# Patient Record
Sex: Female | Born: 1995 | Race: White | Hispanic: No | Marital: Single | Smoking: Former smoker
Health system: Southern US, Community
[De-identification: ages and names within clinical notes are randomized; demographics above are authoritative.]

## PROBLEM LIST (undated history)

## (undated) DIAGNOSIS — J45909 Unspecified asthma, uncomplicated: Secondary | ICD-10-CM

---
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
## 2017-03-12 IMAGING — MR MR HEAD W/O CM
4 of 8 series · 23 of 48 positions shown · non-contrast
Comparison: None.

CLINICAL DATA: Initial e frequent dizzy spells with headaches and
shakiness. History of asthma. Valuation for

EXAM:
MRI HEAD WITHOUT CONTRAST
TECHNIQUE: Multiplanar, multiecho pulse sequences of the brain and surrounding
structures were obtained without intravenous contrast.

[Series 6: T2 · axial · 5.0mm · 0.39mm/px · z∈[-42,+101]mm · 4 of 23 slices shown (1 of 2)]
[im 1/23]
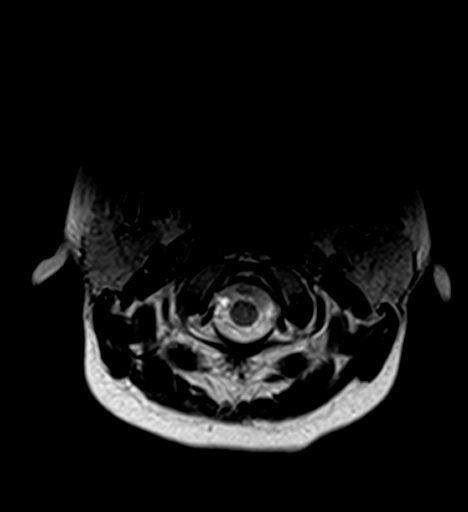
[im 8/23]
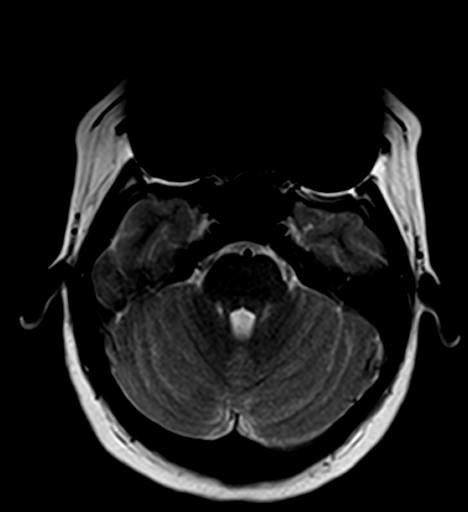
[im 15/23]
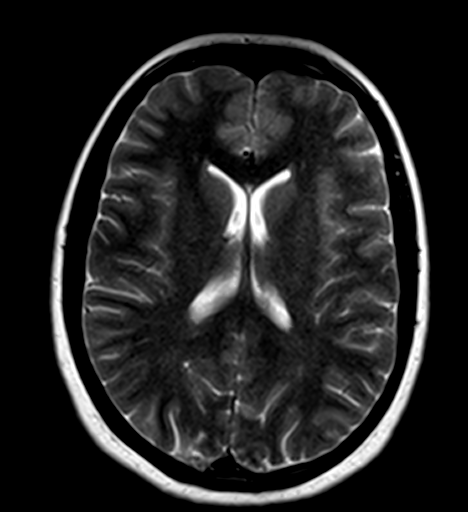
[im 23/23]
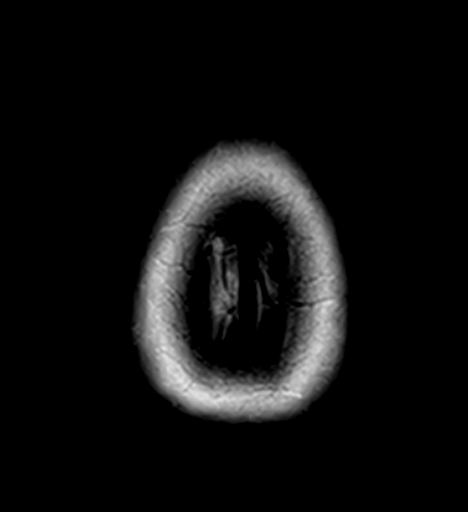

[Series 8: FLAIR · axial · 5.0mm · 0.42mm/px · z∈[-41,+101]mm · 5 of 23 slices shown]
[im 1/23]
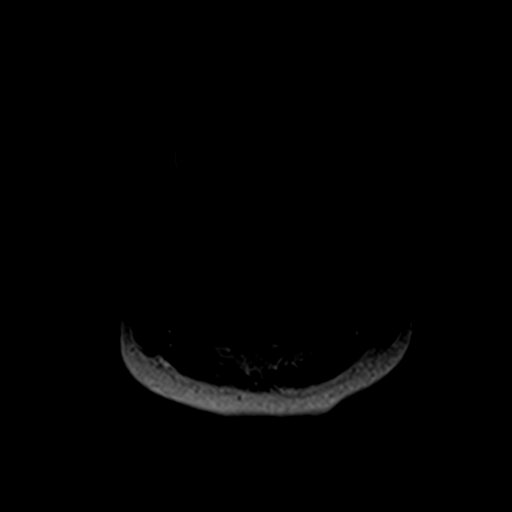
[im 6/23]
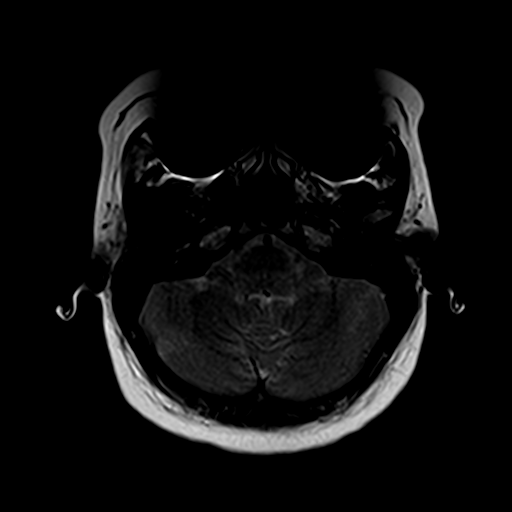
[im 12/23]
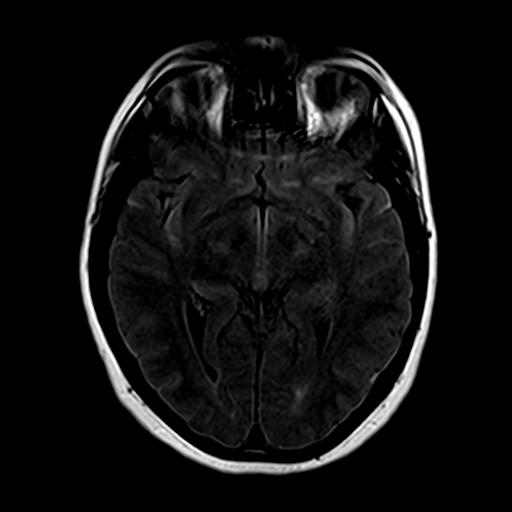
[im 17/23]
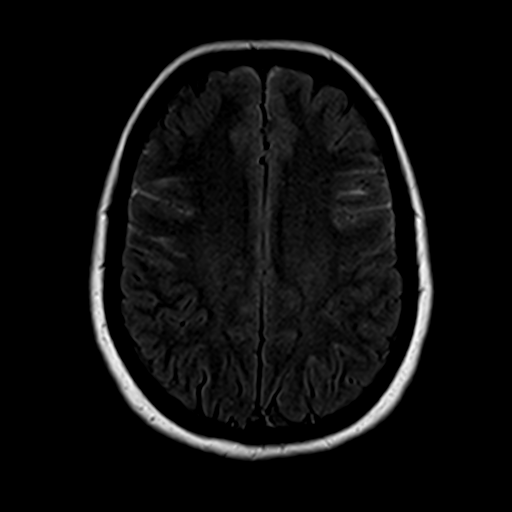
[im 23/23]
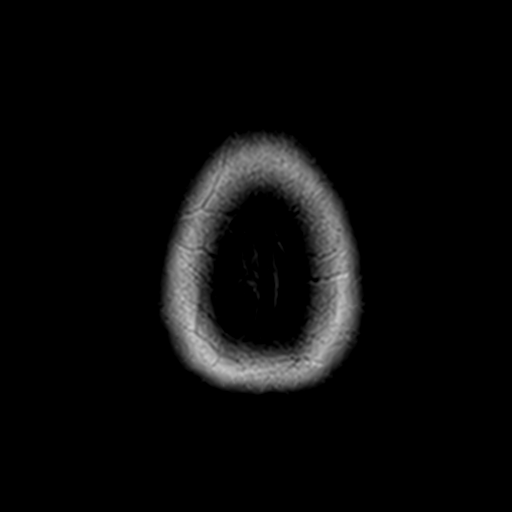

[Series 9: T1 · axial · 2.0mm · 0.42mm/px · z∈[-37,+85]mm · 8 of 73 slices shown]
[im 1/73]
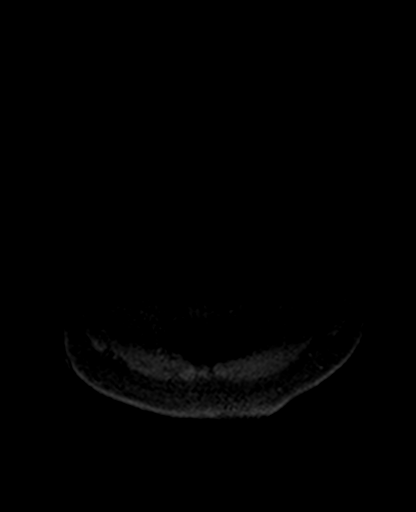
[im 11/73]
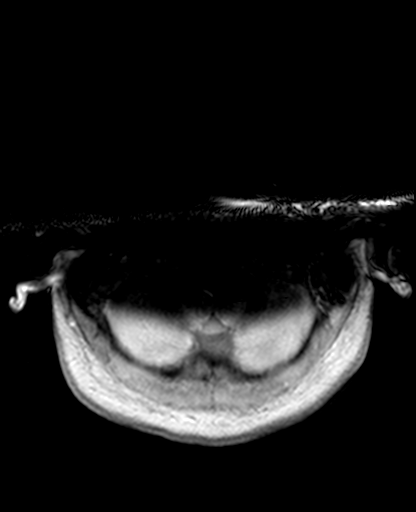
[im 21/73]
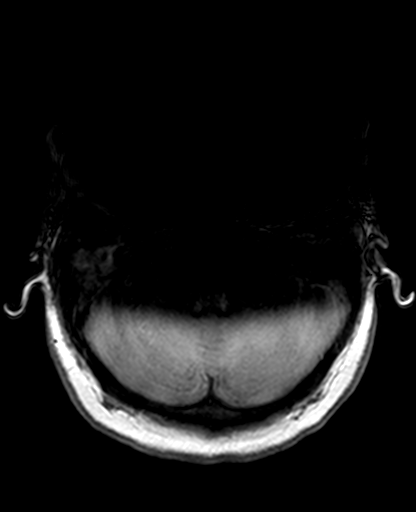
[im 31/73]
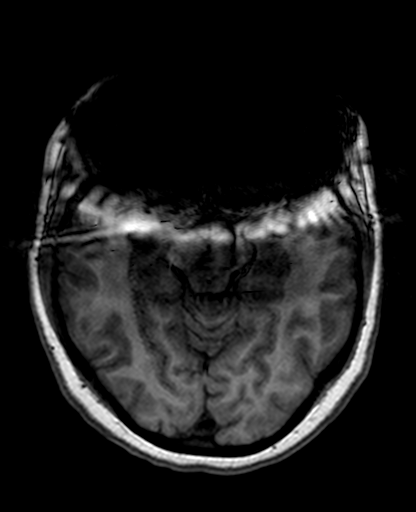
[im 37/73]
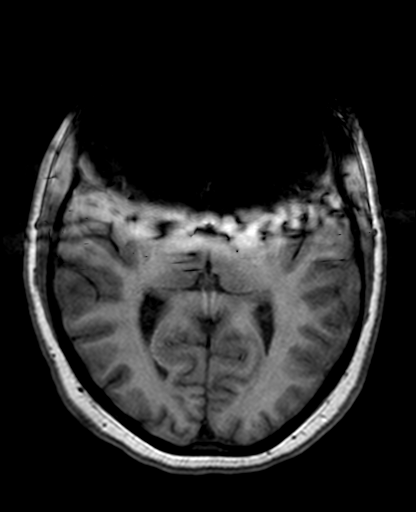
[im 42/73]
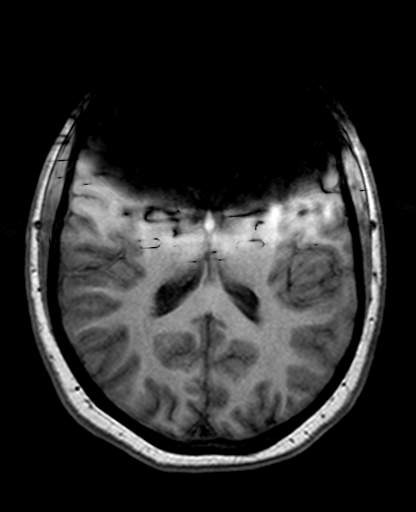
[im 52/73]
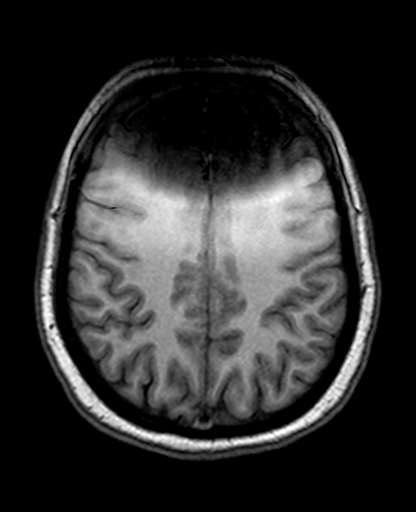
[im 62/73]
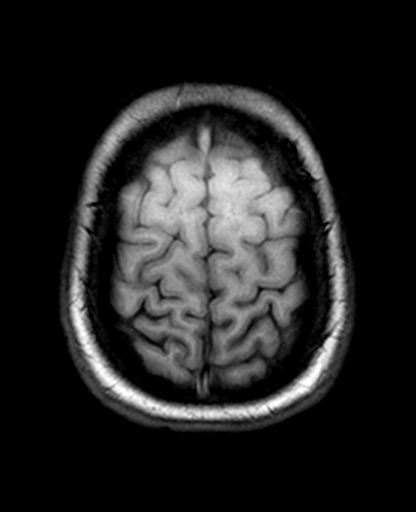

[Series 12: T2 · coronal · 5.0mm · 0.43mm/px · 6 of 29 slices shown (2 of 2)]
[im 1/29]
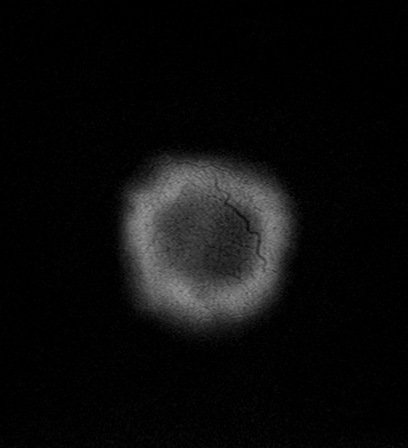
[im 6/29]
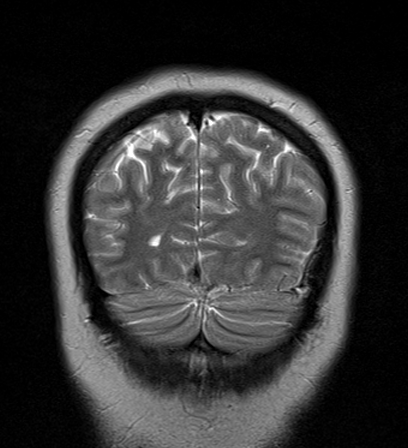
[im 12/29]
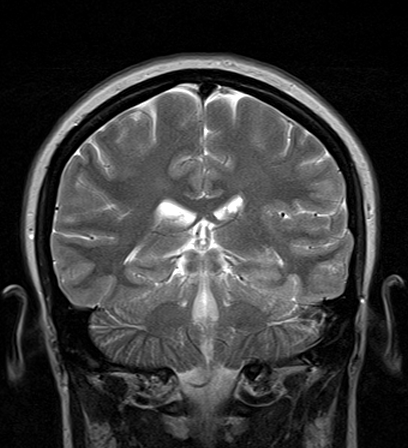
[im 17/29]
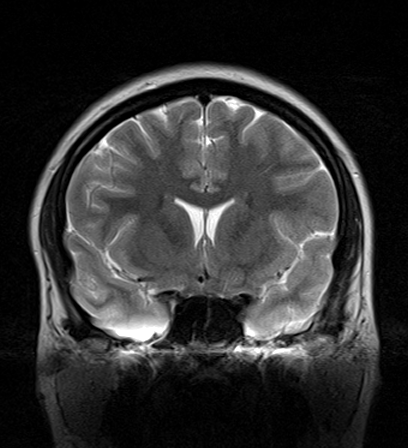
[im 23/29]
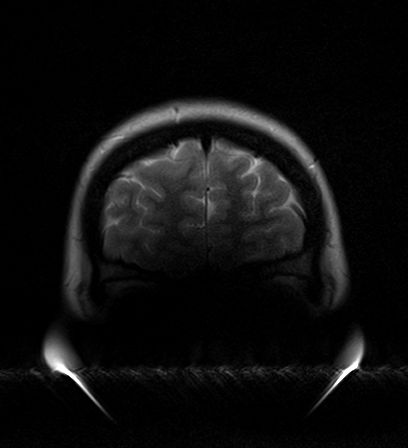
[im 29/29]
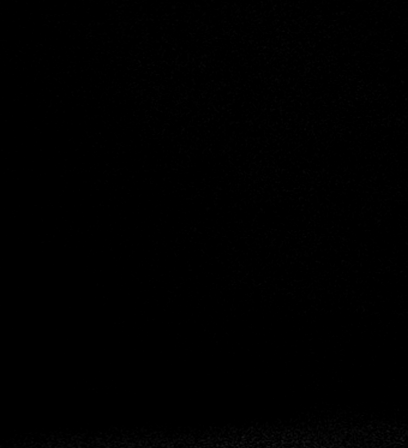

[23 of 48 positions shown; findings below may reference images not displayed]

FINDINGS: Evaluation somewhat limited due to extensive susceptibility artifact
from metallic braces.

Cerebral volume within normal limits for patient age. No definite
focal parenchymal signal abnormality identified. Serpiginous
hyperintense FLAIR signal seen layering within multiple cortical
sulci, is greatest within the anterior frontotemporal regions
favored to be artifactual in nature related to susceptibility
artifact from metallic braces.

Diffusion-weighted imaging was not performed due to extensive
susceptibility artifact. No secondary signs to suggest acute or
chronic ischemia. Major intracranial vascular flow voids are
maintained. Gray-white matter differentiation well preserved.

Midline structures intact and normal in appearance. No mass lesion,
midline shift, or mass effect. No hydrocephalus. No extra-axial
fluid collection.

Craniocervical junction within normal limits. No Chiari
malformation. Visualized upper cervical spine normal.

Pituitary gland within normal limits.

Orbits not well evaluated on this exam due to susceptibility
artifact. Similarly, paranasal sinuses not well evaluated as well.

No mastoid effusion.  Inner ear structures grossly normal.

Visualized bone marrow signal intensity within normal limits. No
scalp soft tissue abnormality.
IMPRESSION: Normal MRI of the brain. Please note study is somewhat limited due
to extensive susceptibility artifact from metallic braces.

## 2023-01-15 ENCOUNTER — Other Ambulatory Visit: Payer: Self-pay

## 2023-01-15 ENCOUNTER — Encounter (HOSPITAL_COMMUNITY): Payer: Self-pay | Admitting: Emergency Medicine

## 2023-01-15 ENCOUNTER — Emergency Department (HOSPITAL_COMMUNITY)
Admission: EM | Admit: 2023-01-15 | Discharge: 2023-01-15 | Disposition: A | Discharge status: Home / Self Care | Payer: Commercial Managed Care - PPO | Attending: Emergency Medicine | Admitting: Emergency Medicine

## 2023-01-15 DIAGNOSIS — N939 Abnormal uterine and vaginal bleeding, unspecified: Secondary | ICD-10-CM | POA: Diagnosis present

## 2023-01-15 LAB — CBC WITH DIFFERENTIAL/PLATELET
Abs Immature Granulocytes: 0.04 10*3/uL (ref 0.00–0.07)
Basophils Absolute: 0 10*3/uL (ref 0.0–0.1)
Basophils Relative: 0 %
Eosinophils Absolute: 0.1 10*3/uL (ref 0.0–0.5)
Eosinophils Relative: 1 %
HCT: 31.1 % — ABNORMAL LOW (ref 36.0–46.0)
Hemoglobin: 10.7 g/dL — ABNORMAL LOW (ref 12.0–15.0)
Immature Granulocytes: 0 %
Lymphocytes Relative: 12 %
Lymphs Abs: 1 10*3/uL (ref 0.7–4.0)
MCH: 29.7 pg (ref 26.0–34.0)
MCHC: 34.4 g/dL (ref 30.0–36.0)
MCV: 86.4 fL (ref 80.0–100.0)
Monocytes Absolute: 0.7 10*3/uL (ref 0.1–1.0)
Monocytes Relative: 7 %
Neutro Abs: 7.2 10*3/uL (ref 1.7–7.7)
Neutrophils Relative %: 80 %
Platelets: 171 10*3/uL (ref 150–400)
RBC: 3.6 MIL/uL — ABNORMAL LOW (ref 3.87–5.11)
RDW: 12 % (ref 11.5–15.5)
WBC: 9 10*3/uL (ref 4.0–10.5)
nRBC: 0 % (ref 0.0–0.2)

## 2023-01-15 LAB — BASIC METABOLIC PANEL
Anion gap: 10 (ref 5–15)
BUN: 8 mg/dL (ref 6–20)
CO2: 20 mmol/L — ABNORMAL LOW (ref 22–32)
Calcium: 8.3 mg/dL — ABNORMAL LOW (ref 8.9–10.3)
Chloride: 103 mmol/L (ref 98–111)
Creatinine, Ser: 0.74 mg/dL (ref 0.44–1.00)
GFR, Estimated: >60 mL/min (ref >60)
Glucose, Bld: 138 mg/dL — ABNORMAL HIGH (ref 70–99)
Potassium: 2.9 mmol/L — ABNORMAL LOW (ref 3.5–5.1)
Sodium: 133 mmol/L — ABNORMAL LOW (ref 135–145)

## 2023-01-15 MED ORDER — ONDANSETRON HCL 4 MG/2ML IJ SOLN
4.0000 mg | Freq: Once | INTRAMUSCULAR | Status: AC
  Administered 2023-01-15: 4 mg via INTRAVENOUS
  Filled 2023-01-15: qty 2

## 2023-01-15 MED ORDER — SODIUM CHLORIDE 0.9 % IV BOLUS
1000.0000 mL | Freq: Once | INTRAVENOUS | Status: AC
  Administered 2023-01-15: 1000 mL via INTRAVENOUS

## 2023-01-15 MED ORDER — KETOROLAC TROMETHAMINE 30 MG/ML IJ SOLN
15.0000 mg | Freq: Once | INTRAMUSCULAR | Status: AC
  Administered 2023-01-15: 15 mg via INTRAVENOUS
  Filled 2023-01-15: qty 1

## 2023-01-15 MED ORDER — MORPHINE SULFATE (PF) 4 MG/ML IV SOLN
4.0000 mg | Freq: Once | INTRAVENOUS | Status: AC
  Administered 2023-01-15: 4 mg via INTRAVENOUS
  Filled 2023-01-15: qty 1

## 2023-01-15 NOTE — Discharge Instructions (Addendum)
Please follow-up with your [facility] provider.  If you develop fever or your bleeding and pain significantly worsen return to emergency department.

## 2023-01-15 NOTE — ED Provider Notes (Signed)
EMERGENCY DEPARTMENT AT [facility] Provider Note   CSN: [id number] Arrival date & time: 01/15/23  0015     History  Chief Complaint  Patient presents with   Abdominal Pain    Abortion 2 days ago    Peggy Jackson is a 27 y.o. female.  Patient presents to the emergency department for evaluation of vaginal bleeding.  Patient reports undergoing an abortion procedure 2 days ago at [facility].  She is experiencing cramping and vaginal bleeding.  Patient reports going through 4 pads today.       Home Medications Prior to Admission medications   Medication Sig Start Date End Date Taking? Authorizing Provider  ibuprofen (ADVIL,MOTRIN) 600 MG tablet Take 1 tablet (600 mg total) by mouth every 6 (six) hours as needed. 03/14/15   Burgess Amor, PA-C      Allergies    Patient has no known allergies.    Review of Systems   Review of Systems  Physical Exam Updated Vital Signs BP (!) 105/59   Pulse (!) 107   Temp 99.2 F (37.3 C) (Oral)   Resp 16   Ht 5\' 4"  (1.626 m)   Wt 81.6 kg   SpO2 100%   BMI 30.88 kg/m  Physical Exam Vitals and nursing note reviewed.  Constitutional:      General: She is not in acute distress.    Appearance: She is well-developed.  HENT:     Head: Normocephalic and atraumatic.     Mouth/Throat:     Mouth: Mucous membranes are moist.  Eyes:     General: Vision grossly intact. Gaze aligned appropriately.     Extraocular Movements: Extraocular movements intact.     Conjunctiva/sclera: Conjunctivae normal.  Cardiovascular:     Rate and Rhythm: Normal rate and regular rhythm.     Pulses: Normal pulses.     Heart sounds: Normal heart sounds, S1 normal and S2 normal. No murmur heard.    No friction rub. No gallop.  Pulmonary:     Effort: Pulmonary effort is normal. No respiratory distress.     Breath sounds: Normal breath sounds.  Abdominal:     General: Bowel sounds are normal.     Palpations: Abdomen is soft.      Tenderness: There is generalized abdominal tenderness. There is no guarding or rebound.     Hernia: No hernia is present.  Musculoskeletal:        General: No swelling.     Cervical back: Full passive range of motion without pain, normal range of motion and neck supple. No spinous process tenderness or muscular tenderness. Normal range of motion.     Right lower leg: No edema.     Left lower leg: No edema.  Skin:    General: Skin is warm and dry.     Capillary Refill: Capillary refill takes less than 2 seconds.     Findings: No ecchymosis, erythema, rash or wound.  Neurological:     General: No focal deficit present.     Mental Status: She is alert and oriented to person, place, and time.     GCS: GCS eye subscore is 4. GCS verbal subscore is 5. GCS motor subscore is 6.     Cranial Nerves: Cranial nerves 2-12 are intact.     Sensory: Sensation is intact.     Motor: Motor function is intact.     Coordination: Coordination is intact.  Psychiatric:  Attention and Perception: Attention normal.        Mood and Affect: Mood normal.        Speech: Speech normal.        Behavior: Behavior normal.     ED Results / Procedures / Treatments   Labs (all labs ordered are listed, but only abnormal results are displayed) Labs Reviewed  CBC WITH DIFFERENTIAL/PLATELET - Abnormal; Notable for the following components:      Result Value   RBC 3.60 (*)    Hemoglobin 10.7 (*)    HCT 31.1 (*)    All other components within normal limits  BASIC METABOLIC PANEL - Abnormal; Notable for the following components:   Sodium 133 (*)    Potassium 2.9 (*)    CO2 20 (*)    Glucose, Bld 138 (*)    Calcium 8.3 (*)    All other components within normal limits    EKG None  Radiology No results found.  Procedures Procedures    Medications Ordered in ED Medications  sodium chloride 0.9 % bolus 1,000 mL (1,000 mLs Intravenous New Bag/Given 01/15/23 0100)  morphine (PF) 4 MG/ML injection 4 mg  (4 mg Intravenous Given 01/15/23 0102)  ondansetron (ZOFRAN) injection 4 mg (4 mg Intravenous Given 01/15/23 0100)  ketorolac (TORADOL) 30 MG/ML injection 15 mg (15 mg Intravenous Given 01/15/23 0101)    ED Course/ Medical Decision Making/ A&P                             Medical Decision Making Amount and/or Complexity of Data Reviewed Labs: ordered.  Risk Prescription drug management.   Patient with vaginal bleeding and cramping after abortion.  She appears well.  In no distress.  Mild tachycardia, no hypotension.  Abdominal exam with mild tenderness, no focal tenderness, guarding or signs of acute surgical process or peritonitis.  No purulent vaginal discharge.  Afebrile, no significant leukocytosis.  Hemoglobin above 10.  Presentation with some bleeding and cramping suspected after the procedure.  At this point nothing to indicate endomyometritis.  I do not have ultrasound available currently, feel the patient is safe for discharge and follow-up with her provider.  Return for fever or worsening symptoms.        Final Clinical Impression(s) / ED Diagnoses Final diagnoses:  Vaginal bleeding    Rx / DC Orders ED Discharge Orders     None         Gorden Stthomas, Canary Brim, MD 01/15/23 0147

## 2023-01-15 NOTE — ED Triage Notes (Addendum)
Pt had surgical abortion 2 days ago, now presents with abd cramping and vag bleeding. Reports going through 4 pads the entire day up until now

## 2024-08-01 ENCOUNTER — Ambulatory Visit
Admission: EM | Admit: 2024-08-01 | Discharge: 2024-08-01 | Disposition: A | Discharge status: Home / Self Care | Payer: Self-pay | Attending: Family Medicine | Admitting: Family Medicine

## 2024-08-01 ENCOUNTER — Encounter: Payer: Self-pay | Admitting: Emergency Medicine

## 2024-08-01 ENCOUNTER — Other Ambulatory Visit: Payer: Self-pay

## 2024-08-01 DIAGNOSIS — J069 Acute upper respiratory infection, unspecified: Secondary | ICD-10-CM

## 2024-08-01 DIAGNOSIS — J4521 Mild intermittent asthma with (acute) exacerbation: Secondary | ICD-10-CM

## 2024-08-01 MED ORDER — PROMETHAZINE-DM 6.25-15 MG/5ML PO SYRP: 5.0000 mL | ORAL_SOLUTION | Freq: Four times a day (QID) | ORAL | 0 refills | Status: AC | PRN

## 2024-08-01 MED ORDER — PREDNISONE 20 MG PO TABS: 40.0000 mg | ORAL_TABLET | Freq: Every day | ORAL | 0 refills | Status: AC

## 2024-08-01 NOTE — ED Triage Notes (Signed)
 Pt reports exposure to flu A, reports symptoms have all improved but reports  cough and shortness of breath remains x3 days. Pt alert, oriented, able to speak clear complete sentences. NAD noted.

## 2024-08-01 NOTE — ED Provider Notes (Signed)
 " [facility]    CSN: [id number] Arrival date & time: 08/01/24  9074      History   Chief Complaint Chief Complaint  Patient presents with   Shortness of Breath    HPI Peggy Jackson is a 29 y.o. female.   Patient presenting today with 3-day history of flulike symptoms to include congestion, cough, body aches, chills.  States overall symptoms are improving but still having wheezing, chest tightness, cough.  So far trying NyQuil and albuterol with minimal relief.  History of asthma on albuterol as needed.    Past Medical History:  Diagnosis Date   Asthma     There are no active problems to display for this patient.   History reviewed. No pertinent surgical history.  OB History   No obstetric history on file.      Home Medications    Prior to Admission medications  Medication Sig Start Date End Date Taking? Authorizing Provider  predniSONE (DELTASONE) 20 MG tablet Take 2 tablets (40 mg total) by mouth daily with breakfast. 08/01/24  Yes Stuart Vernell Norris, PA-C  promethazine-dextromethorphan (PROMETHAZINE-DM) 6.25-15 MG/5ML syrup Take 5 mLs by mouth 4 (four) times daily as needed. 08/01/24  Yes Stuart Vernell Norris, PA-C  ibuprofen  (ADVIL ,MOTRIN ) 600 MG tablet Take 1 tablet (600 mg total) by mouth every 6 (six) hours as needed. 03/14/15   Birdena Clarity, PA-C    Family History History reviewed. No pertinent family history.  Social History Social History[1]   Allergies   Patient has no known allergies.   Review of Systems Review of Systems Per HPI  Physical Exam Triage Vital Signs ED Triage Vitals  Encounter Vitals Group     BP 08/01/24 1002 111/75     Girls Systolic BP Percentile --      Girls Diastolic BP Percentile --      Boys Systolic BP Percentile --      Boys Diastolic BP Percentile --      Pulse Rate 08/01/24 1002 80     Resp 08/01/24 1002 20     Temp 08/01/24 1002 98.5 F (36.9 C)     Temp Source 08/01/24 1002 Oral     SpO2  08/01/24 1002 97 %     Weight --      Height --      Head Circumference --      Peak Flow --      Pain Score 08/01/24 1000 0     Pain Loc --      Pain Education --      Exclude from Growth Chart --    No data found.  Updated Vital Signs BP 111/75 (BP Location: Right Arm)   Pulse 80   Temp 98.5 F (36.9 C) (Oral)   Resp 20   LMP 07/19/2024 (Approximate)   SpO2 97%   Visual Acuity Right Eye Distance:   Left Eye Distance:   Bilateral Distance:    Right Eye Near:   Left Eye Near:    Bilateral Near:     Physical Exam Vitals and nursing note reviewed.  Constitutional:      Appearance: Normal appearance.  HENT:     Head: Atraumatic.     Right Ear: Tympanic membrane and external ear normal.     Left Ear: Tympanic membrane and external ear normal.     Nose: Rhinorrhea present.     Mouth/Throat:     Mouth: Mucous membranes are moist.     Pharynx: Posterior  oropharyngeal erythema present.  Eyes:     Extraocular Movements: Extraocular movements intact.     Conjunctiva/sclera: Conjunctivae normal.  Cardiovascular:     Rate and Rhythm: Normal rate and regular rhythm.     Heart sounds: Normal heart sounds.  Pulmonary:     Effort: Pulmonary effort is normal.     Breath sounds: Normal breath sounds. No wheezing or rales.  Musculoskeletal:        General: Normal range of motion.     Cervical back: Normal range of motion and neck supple.  Skin:    General: Skin is warm and dry.  Neurological:     Mental Status: She is alert and oriented to person, place, and time.  Psychiatric:        Mood and Affect: Mood normal.        Thought Content: Thought content normal.      UC Treatments / Results  Labs (all labs ordered are listed, but only abnormal results are displayed) Labs Reviewed - No data to display  EKG   Radiology No results found.  Procedures Procedures (including critical care time)  Medications Ordered in UC Medications - No data to display  Initial  Impression / Assessment and Plan / UC Course  I have reviewed the triage vital signs and the nursing notes.  Pertinent labs & imaging results that were available during my care of the patient were reviewed by me and considered in my medical decision making (see chart for details).     Suspect viral respiratory infection causing asthma exacerbation.  Treat with prednisone, Phenergan DM, albuterol every 4 hours as needed which she states she has at home.  Return for worsening or unresolving symptoms.  Final Clinical Impressions(s) / UC Diagnoses   Final diagnoses:  Viral URI with cough  Mild intermittent asthma with acute exacerbation   Discharge Instructions   None    ED Prescriptions     Medication Sig Dispense Auth. Provider   predniSONE (DELTASONE) 20 MG tablet Take 2 tablets (40 mg total) by mouth daily with breakfast. 10 tablet Stuart Vernell Norris, PA-C   promethazine-dextromethorphan (PROMETHAZINE-DM) 6.25-15 MG/5ML syrup Take 5 mLs by mouth 4 (four) times daily as needed. 100 mL Stuart Vernell Norris, [place]      PDMP not reviewed this encounter.    [1]  Social History Tobacco Use   Smoking status: Former  Substance Use Topics   Alcohol use: No   Drug use: Never     Stuart Vernell Norris, PA-C 08/01/24 1151  "
